# Patient Record
Sex: Female | Born: 1945 | Race: White | Hispanic: No | Marital: Married
Health system: Southern US, Community
[De-identification: ages and names within clinical notes are randomized; demographics above are authoritative.]

---

## 2006-08-08 ENCOUNTER — Other Ambulatory Visit: Payer: Self-pay

## 2006-08-08 ENCOUNTER — Emergency Department: Payer: Self-pay | Admitting: Emergency Medicine
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
## 2019-06-01 ENCOUNTER — Ambulatory Visit: Payer: Medicare Other | Attending: Family Medicine

## 2019-06-01 DIAGNOSIS — Z23 Encounter for immunization: Secondary | ICD-10-CM | POA: Insufficient documentation

## 2019-06-01 NOTE — Progress Notes (Signed)
   Covid-[facility] Vaccination [facility]  Name:  Mary Walker    MRN: [id number] DOB: [birth year]  06/01/2019  [name] was observed post Covid-19 immunization for 15 minutes without incidence. She was provided with Vaccine Information Sheet and instruction to access the V-Safe system.   [name] was instructed to call 911 with any severe reactions post vaccine: Marland Kitchen Difficulty breathing  . Swelling of your face and throat  . A fast heartbeat  . A bad rash all over your body  . Dizziness and weakness    Immunizations Administered    Name Date Dose VIS Date Route   Pfizer COVID-19 Vaccine 06/01/2019 11:58 AM 0.3 mL 03/14/2019 Intramuscular   Manufacturer: [employer], [employer]   Lot: [id number]   NDC: [address]-3

## 2019-06-24 ENCOUNTER — Ambulatory Visit: Payer: Medicare Other | Attending: Internal Medicine

## 2019-06-24 DIAGNOSIS — Z23 Encounter for immunization: Secondary | ICD-10-CM

## 2019-06-24 NOTE — Progress Notes (Signed)
   Covid-19 Vaccination Clinic  Name:  Mary Walker    MRN: [id number] DOB: 1945-09-08  06/24/2019  Mary Walker was observed post Covid-19 immunization for 15 minutes without incident. She was provided with Vaccine Information Sheet and instruction to access the V-Safe system.   Mary Walker was instructed to call 911 with any severe reactions post vaccine: Marland Kitchen Difficulty breathing  . Swelling of face and throat  . A fast heartbeat  . A bad rash all over body  . Dizziness and weakness   Immunizations Administered    Name Date Dose VIS Date Route   Pfizer COVID-19 Vaccine 06/24/2019 10:23 AM 0.3 mL 03/14/2019 Intramuscular   Manufacturer: [employer], [employer]   Lot: [id number]   NDC: [address]-4

## 2022-02-14 DIAGNOSIS — Z7189 Other specified counseling: Secondary | ICD-10-CM | POA: Diagnosis not present

## 2022-02-14 DIAGNOSIS — Z0000 Encounter for general adult medical examination without abnormal findings: Secondary | ICD-10-CM | POA: Diagnosis not present
